# Patient Record
Sex: Female | Born: 1975 | Race: White | Hispanic: No | Marital: Single | Smoking: Never smoker
Health system: Southern US, Community
[De-identification: ages and names within clinical notes are randomized; demographics above are authoritative.]

## PROBLEM LIST (undated history)

## (undated) DIAGNOSIS — F329 Major depressive disorder, single episode, unspecified: Secondary | ICD-10-CM

## (undated) DIAGNOSIS — F32A Depression, unspecified: Secondary | ICD-10-CM

## (undated) DIAGNOSIS — K219 Gastro-esophageal reflux disease without esophagitis: Secondary | ICD-10-CM

## (undated) DIAGNOSIS — J45909 Unspecified asthma, uncomplicated: Secondary | ICD-10-CM

## (undated) DIAGNOSIS — G4761 Periodic limb movement disorder: Secondary | ICD-10-CM

## (undated) DIAGNOSIS — M5137 Other intervertebral disc degeneration, lumbosacral region: Secondary | ICD-10-CM

## (undated) HISTORY — PX: LUMBAR DISC SURGERY: SHX700

## (undated) HISTORY — PX: BACK SURGERY: SHX140

## (undated) HISTORY — PX: SHOULDER SURGERY: SHX246

## (undated) HISTORY — PX: MOUTH SURGERY: SHX715

---

## 2015-06-09 ENCOUNTER — Ambulatory Visit (HOSPITAL_COMMUNITY)
Admission: EM | Admit: 2015-06-09 | Discharge: 2015-06-09 | Disposition: A | Discharge status: Home / Self Care | Payer: BLUE CROSS/BLUE SHIELD | Attending: Family Medicine | Admitting: Family Medicine

## 2015-06-09 ENCOUNTER — Ambulatory Visit (INDEPENDENT_AMBULATORY_CARE_PROVIDER_SITE_OTHER): Payer: BLUE CROSS/BLUE SHIELD

## 2015-06-09 ENCOUNTER — Encounter (HOSPITAL_COMMUNITY): Payer: Self-pay | Admitting: Emergency Medicine

## 2015-06-09 DIAGNOSIS — S46912A Strain of unspecified muscle, fascia and tendon at shoulder and upper arm level, left arm, initial encounter: Secondary | ICD-10-CM

## 2015-06-09 NOTE — Discharge Instructions (Signed)
Rotator Cuff Injury °Rotator cuff injury is any type of injury to the set of muscles and tendons that make up the stabilizing unit of your shoulder. This unit holds the ball of your upper arm bone (humerus) in the socket of your shoulder blade (scapula).  °CAUSES °Injuries to your rotator cuff most commonly come from sports or activities that cause your arm to be moved repeatedly over your head. Examples of this include throwing, weight lifting, swimming, or racquet sports. Long lasting (chronic) irritation of your rotator cuff can cause soreness and swelling (inflammation), bursitis, and eventual damage to your tendons, such as a tear (rupture). °SIGNS AND SYMPTOMS °Acute rotator cuff tear: °· Sudden tearing sensation followed by severe pain shooting from your upper shoulder down your arm toward your elbow. °· Decreased range of motion of your shoulder because of pain and muscle spasm. °· Severe pain. °· Inability to raise your arm out to the side because of pain and loss of muscle power (large tears). °Chronic rotator cuff tear: °· Pain that usually is worse at night and may interfere with sleep. °· Gradual weakness and decreased shoulder motion as the pain worsens. °· Decreased range of motion. °Rotator cuff tendinitis:  °· Deep ache in your shoulder and the outside upper arm over your shoulder. °· Pain that comes on gradually and becomes worse when lifting your arm to the side or turning it inward. °DIAGNOSIS °Rotator cuff injury is diagnosed through a medical history, physical exam, and imaging exam. The medical history helps determine the type of rotator cuff injury. Your health care provider will look at your injured shoulder, feel the injured area, and ask you to move your shoulder in different positions. X-ray exams typically are done to rule out other causes of shoulder pain, such as fractures. MRI is the exam of choice for the most severe shoulder injuries because the images show muscles and tendons.    °TREATMENT  °Chronic tear: °· Medicine for pain, such as acetaminophen or ibuprofen. °· Physical therapy and range-of-motion exercises may be helpful in maintaining shoulder function and strength. °· Steroid injections into your shoulder joint. °· Surgical repair of the rotator cuff if the injury does not heal with noninvasive treatment. °Acute tear: °· Anti-inflammatory medicines such as ibuprofen and naproxen to help reduce pain and swelling. °· A sling to help support your arm and rest your rotator cuff muscles. Long-term use of a sling is not advised. It may cause significant stiffening of the shoulder joint. °· Surgery may be considered within a few weeks, especially in younger, active people, to return the shoulder to full function. °· Indications for surgical treatment include the following: °¨ Age younger than 60 years. °¨ Rotator cuff tears that are complete. °¨ Physical therapy, rest, and anti-inflammatory medicines have been used for 6-8 weeks, with no improvement. °¨ Employment or sporting activity that requires constant shoulder use. °Tendinitis: °· Anti-inflammatory medicines such as ibuprofen and naproxen to help reduce pain and swelling. °· A sling to help support your arm and rest your rotator cuff muscles. Long-term use of a sling is not advised. It may cause significant stiffening of the shoulder joint. °· Severe tendinitis may require: °¨ Steroid injections into your shoulder joint. °¨ Physical therapy. °¨ Surgery. °HOME CARE INSTRUCTIONS  °· Apply ice to your injury: °¨ Put ice in a plastic bag. °¨ Place a towel between your skin and the bag. °¨ Leave the ice on for 20 minutes, 2-3 times a day. °· If you   have a shoulder immobilizer (sling and straps), wear it until told otherwise by your health care provider. °· You may want to sleep on several pillows or in a recliner at night to lessen swelling and pain. °· Only take over-the-counter or prescription medicines for pain, discomfort, or fever as  directed by your health care provider. °· Do simple hand squeezing exercises with a soft rubber ball to decrease hand swelling. °SEEK MEDICAL CARE IF:  °· Your shoulder pain increases, or new pain or numbness develops in your arm, hand, or fingers. °· Your hand or fingers are colder than your other hand. °SEEK IMMEDIATE MEDICAL CARE IF:  °· Your arm, hand, or fingers are numb or tingling. °· Your arm, hand, or fingers are increasingly swollen and painful, or they turn white or blue. °MAKE SURE YOU: °· Understand these instructions. °· Will watch your condition. °· Will get help right away if you are not doing well or get worse. °  °This information is not intended to replace advice given to you by your health care provider. Make sure you discuss any questions you have with your health care provider. °  °Document Released: 02/12/2000 Document Revised: 02/19/2013 Document Reviewed: 09/26/2012 °Elsevier Interactive Patient Education ©2016 [employer] ° °
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
## 2015-08-09 ENCOUNTER — Ambulatory Visit (INDEPENDENT_AMBULATORY_CARE_PROVIDER_SITE_OTHER): Payer: BLUE CROSS/BLUE SHIELD

## 2015-08-09 ENCOUNTER — Ambulatory Visit (HOSPITAL_COMMUNITY)
Admission: EM | Admit: 2015-08-09 | Discharge: 2015-08-09 | Disposition: A | Discharge status: Home / Self Care | Payer: BLUE CROSS/BLUE SHIELD | Attending: Emergency Medicine | Admitting: Emergency Medicine

## 2015-08-09 ENCOUNTER — Encounter (HOSPITAL_COMMUNITY): Payer: Self-pay | Admitting: *Deleted

## 2015-08-09 DIAGNOSIS — M25552 Pain in left hip: Secondary | ICD-10-CM | POA: Diagnosis not present

## 2015-08-09 DIAGNOSIS — R52 Pain, unspecified: Secondary | ICD-10-CM

## 2015-08-09 HISTORY — DX: Major depressive disorder, single episode, unspecified: F32.9

## 2015-08-09 HISTORY — DX: Gastro-esophageal reflux disease without esophagitis: K21.9

## 2015-08-09 HISTORY — DX: Unspecified asthma, uncomplicated: J45.909

## 2015-08-09 HISTORY — DX: Other intervertebral disc degeneration, lumbosacral region: M51.37

## 2015-08-09 HISTORY — DX: Periodic limb movement disorder: G47.61

## 2015-08-09 HISTORY — DX: Depression, unspecified: F32.A

## 2015-08-09 MED ORDER — PREDNISONE 10 MG PO TABS: 20.0000 mg | ORAL_TABLET | Freq: Every day | ORAL | Status: AC

## 2015-08-09 NOTE — Discharge Instructions (Signed)
Cryotherapy °Cryotherapy is when you put ice on your injury. Ice helps lessen pain and puffiness (swelling) after an injury. Ice works the best when you start using it in the first 24 to 48 hours after an injury. °HOME CARE °· Put a dry or damp towel between the ice pack and your skin. °· You may press gently on the ice pack. °· Leave the ice on for no more than 10 to 20 minutes at a time. °· Check your skin after 5 minutes to make sure your skin is okay. °· Rest at least 20 minutes between ice pack uses. °· Stop using ice when your skin loses feeling (numbness). °· Do not use ice on someone who cannot tell you when it hurts. This includes small children and people with memory problems (dementia). °GET HELP RIGHT AWAY IF: °· You have white spots on your skin. °· Your skin turns blue or pale. °· Your skin feels waxy or hard. °· Your puffiness gets worse. °MAKE SURE YOU:  °· Understand these instructions. °· Will watch your condition. °· Will get help right away if you are not doing well or get worse. °  °This information is not intended to replace advice given to you by your health care provider. Make sure you discuss any questions you have with your health care provider. °  °Document Released: 08/03/2007 Document Revised: 05/09/2011 Document Reviewed: 10/07/2010 °Elsevier Interactive Patient Education ©2016 Elsevier Inc. ° °Heat Therapy °Heat therapy can help ease sore, stiff, injured, and tight muscles and joints. Heat relaxes your muscles, which may help ease your pain. Heat therapy should only be used on old, pre-existing, or long-lasting (chronic) injuries. Do not use heat therapy unless told by your doctor. °HOW TO USE HEAT THERAPY °There are several different kinds of heat therapy, including: °· Moist heat pack. °· Warm water bath. °· Hot water bottle. °· Electric heating pad. °· Heated gel pack. °· Heated wrap. °· Electric heating pad. °GENERAL HEAT THERAPY RECOMMENDATIONS  °· Do not sleep while using heat  therapy. Only use heat therapy while you are awake. °· Your skin may turn pink while using heat therapy. Do not use heat therapy if your skin turns red. °· Do not use heat therapy if you have new pain. °· High heat or long exposure to heat can cause burns. Be careful when using heat therapy to avoid burning your skin. °· Do not use heat therapy on areas of your skin that are already irritated, such as with a rash or sunburn. °GET HELP IF:  °· You have blisters, redness, swelling (puffiness), or numbness. °· You have new pain. °· Your pain is worse. °MAKE SURE YOU: °· Understand these instructions. °· Will watch your condition. °· Will get help right away if you are not doing well or get worse. °  °This information is not intended to replace advice given to you by your health care provider. Make sure you discuss any questions you have with your health care provider. °  °Document Released: 05/09/2011 Document Revised: 03/07/2014 Document Reviewed: 04/09/2013 °Elsevier Interactive Patient Education ©2016 Elsevier Inc. ° °

## 2015-08-09 NOTE — ED Notes (Signed)
Denies injury.  C/O left "ripping" hip pain x 1.5 wks.  Has tried her normal cyclobenzaprine, rest, ice, and heat without relief.

## 2015-08-09 NOTE — ED Provider Notes (Signed)
CSN: [id number]     Arrival date & time 08/09/15  1554 History   First MD Initiated Contact with Patient 08/09/15 1624     Chief Complaint  Patient presents with  . Hip Pain   (Consider location/radiation/quality/duration/timing/severity/associated sxs/prior Treatment) HPI History obtained from patient:  Pt presents with the cc of:  Left hip pain Duration of symptoms: 2 weeks Treatment prior to arrival: OTC, rest Context: sudden pain in left hip no known injury. Tearing sensation in hip Other symptoms include: hurts to get around Pain score: 6 FAMILY HISTORY:none mentioned    Past Medical History  Diagnosis Date  . Asthma   . GERD (gastroesophageal reflux disease)   . DDD (degenerative disc disease), lumbosacral   . Depression   . Periodic limb movement disorder (PLMD)    Past Surgical History  Procedure Laterality Date  . Back surgery    . Lumbar disc surgery    . Shoulder surgery      x3  . Mouth surgery     No family history on file. Social History  Substance Use Topics  . Smoking status: Never Smoker   . Smokeless tobacco: None  . Alcohol Use: Yes     Comment: occasionally   OB History    No data available     Review of Systems  Denies: HEADACHE, NAUSEA, ABDOMINAL PAIN, CHEST PAIN, CONGESTION, DYSURIA, SHORTNESS OF BREATH  Allergies  Baclofen; Compazine; Neosporin; Relafen; and Tramadol  Home Medications   Prior to Admission medications   Medication Sig Start Date End Date Taking? Authorizing Provider  albuterol (PROVENTIL HFA;VENTOLIN HFA) 108 (90 Base) MCG/ACT inhaler Inhale into the lungs every 6 (six) hours as needed for wheezing or shortness of breath.   Yes Historical Provider, MD  beclomethasone (QVAR) 40 MCG/ACT inhaler Inhale into the lungs 2 (two) times daily.   Yes Historical Provider, MD  cyclobenzaprine (FLEXERIL) 5 MG tablet Take 5 mg by mouth 3 (three) times daily as needed for muscle spasms.   Yes Historical Provider, MD  Diclofenac  Sodium (PENNSAID) 2 % SOLN Place onto the skin.   Yes Historical Provider, MD  ferrous sulfate 325 (65 FE) MG tablet Take 325 mg by mouth daily with breakfast.   Yes Historical Provider, MD  FLUoxetine (PROZAC) 40 MG capsule Take 40 mg by mouth daily.   Yes Historical Provider, MD  Gabapentin, Once-Daily, (GRALISE) 600 MG TABS Take by mouth.   Yes Historical Provider, MD  pantoprazole (PROTONIX) 20 MG tablet Take 20 mg by mouth daily.   Yes Historical Provider, MD  Armodafinil (NUVIGIL) 150 MG tablet Take 150 mg by mouth daily.    Historical Provider, MD  diazepam (VALIUM) 2 MG tablet Take 2 mg by mouth every 6 (six) hours as needed for anxiety.    Historical Provider, MD  oxyCODONE-acetaminophen (PERCOCET/ROXICET) 5-325 MG tablet Take by mouth every 4 (four) hours as needed for severe pain.    Historical Provider, MD   Meds Ordered and Administered this Visit  Medications - No data to display  BP 137/97 mmHg  Pulse 94  Temp(Src) 97.7 F (36.5 C) (Oral)  Resp 16  SpO2 99%  LMP 07/26/2015 (Approximate) No data found.   Physical Exam NURSES NOTES AND VITAL SIGNS REVIEWED. CONSTITUTIONAL: Well developed, well nourished, no acute distress HEENT: normocephalic, atraumatic EYES: Conjunctiva normal NECK:normal ROM, supple, no adenopathy PULMONARY:No respiratory distress, normal effort ABDOMINAL: Soft, ND, NT BS+, No CVAT MUSCULOSKELETAL: Normal ROM of all extremities,  SKIN: warm and  dry without rash PSYCHIATRIC: Mood and affect, behavior are normal  ED Course  Procedures (including critical care time)  Labs Review Labs Reviewed - No data to display  Imaging Review Dg Hip Unilat With Pelvis 2-3 Views Left  08/09/2015  CLINICAL DATA:  Left hip pain for the past 2 weeks. EXAM: DG HIP (WITH OR WITHOUT PELVIS) 2-3V LEFT COMPARISON:  None. FINDINGS: Clothing artifacts.  Normal appearing hips and lower lumbar spine. IMPRESSION: Normal examination. Electronically Signed   By: Beckie Salts  M.D.   On: 08/09/2015 16:32    Personal review of xray-and report was used in the MDM process.  Visual Acuity Review  Right Eye Distance:   Left Eye Distance:   Bilateral Distance:    Right Eye Near:   Left Eye Near:    Bilateral Near:      RX prednisone   MDM   1. Pain    Patient is reassured that there are no issues that require transfer to higher level of care at this time or additional tests. Patient is advised to continue home symptomatic treatment. Patient is advised that if there are new or worsening symptoms to attend the emergency department, contact primary care provider, or return to UC. Instructions of care provided discharged home in stable condition.    THIS NOTE WAS GENERATED USING A VOICE RECOGNITION SOFTWARE PROGRAM. ALL REASONABLE EFFORTS  WERE MADE TO PROOFREAD THIS DOCUMENT FOR ACCURACY.  I have verbally reviewed the discharge instructions with the patient. A printed AVS was given to the patient.  All questions were answered prior to discharge.      Tharon Aquas, PA 08/09/15 1654

## 2016-08-23 IMAGING — DX DG HIP (WITH OR WITHOUT PELVIS) 2-3V*L*
3 series · 3 of 3 positions shown · non-contrast
Comparison: None.

CLINICAL DATA: Left hip pain for the past 2 weeks.

EXAM:
DG HIP (WITH OR WITHOUT PELVIS) 2-3V LEFT

[hip frog leg]
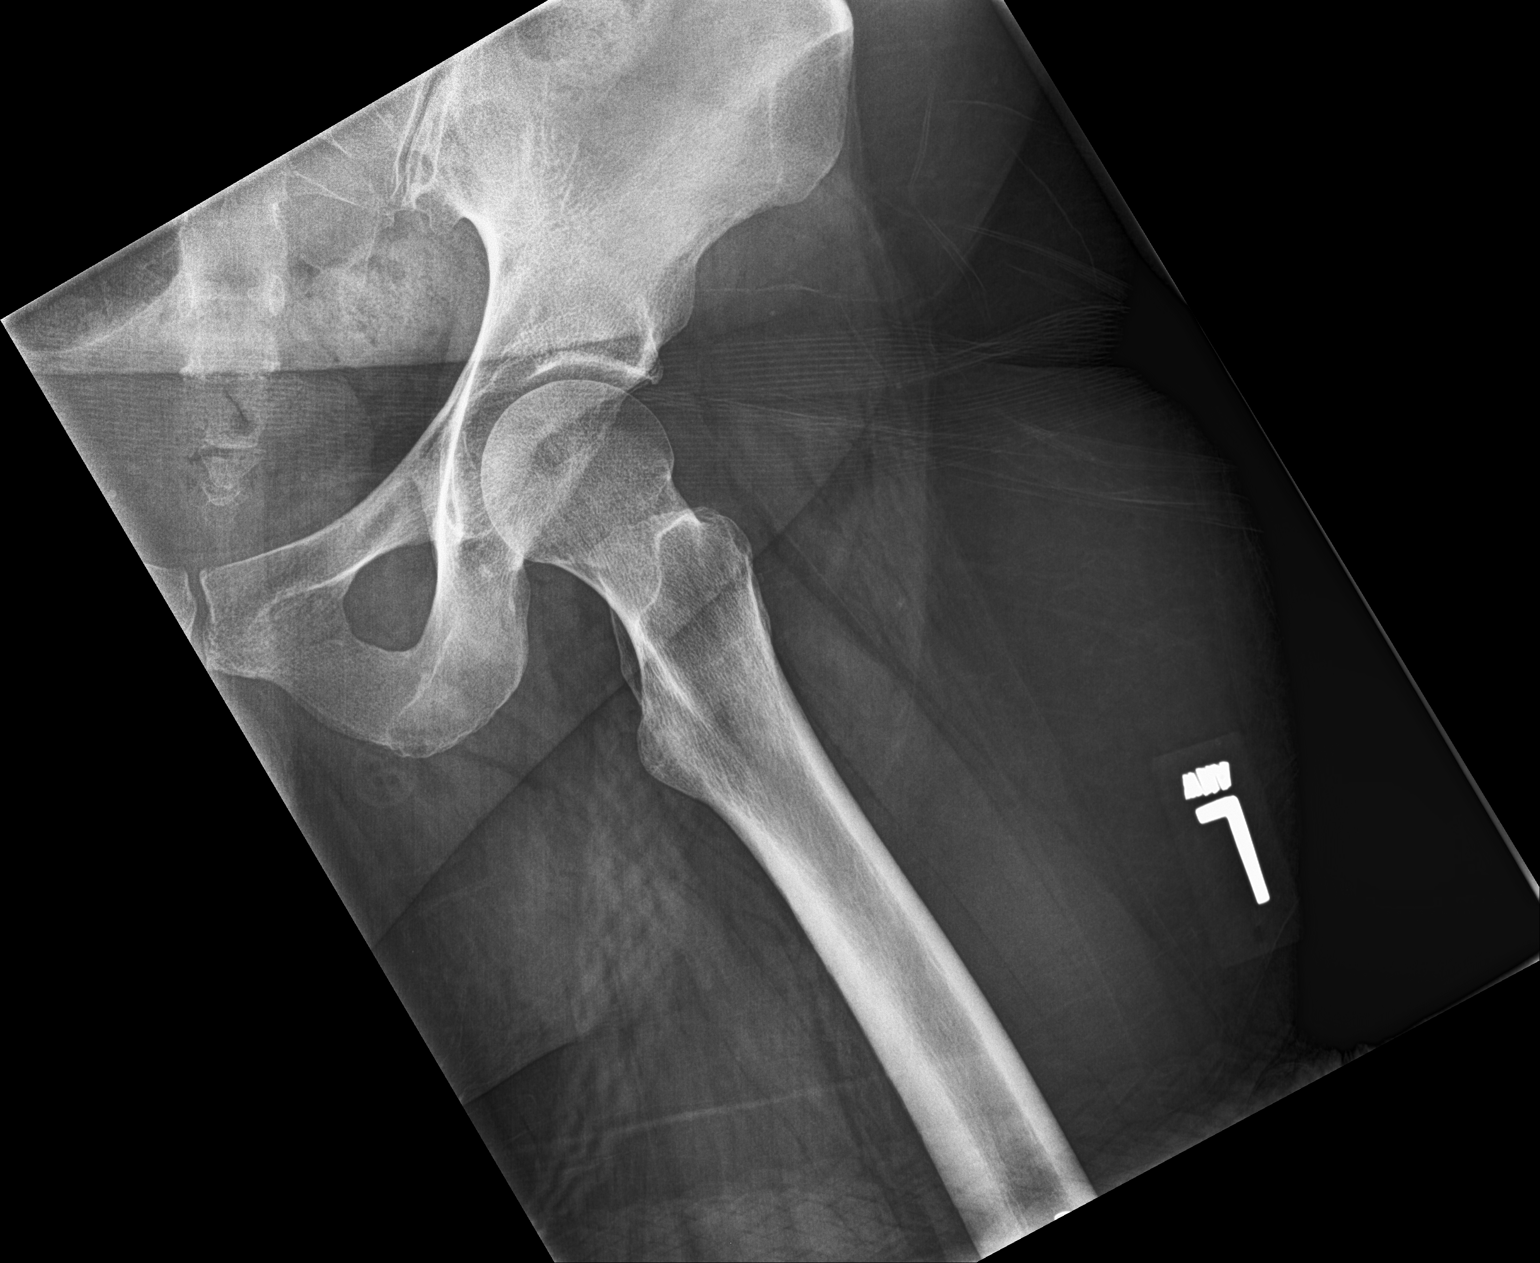

[hip ap]
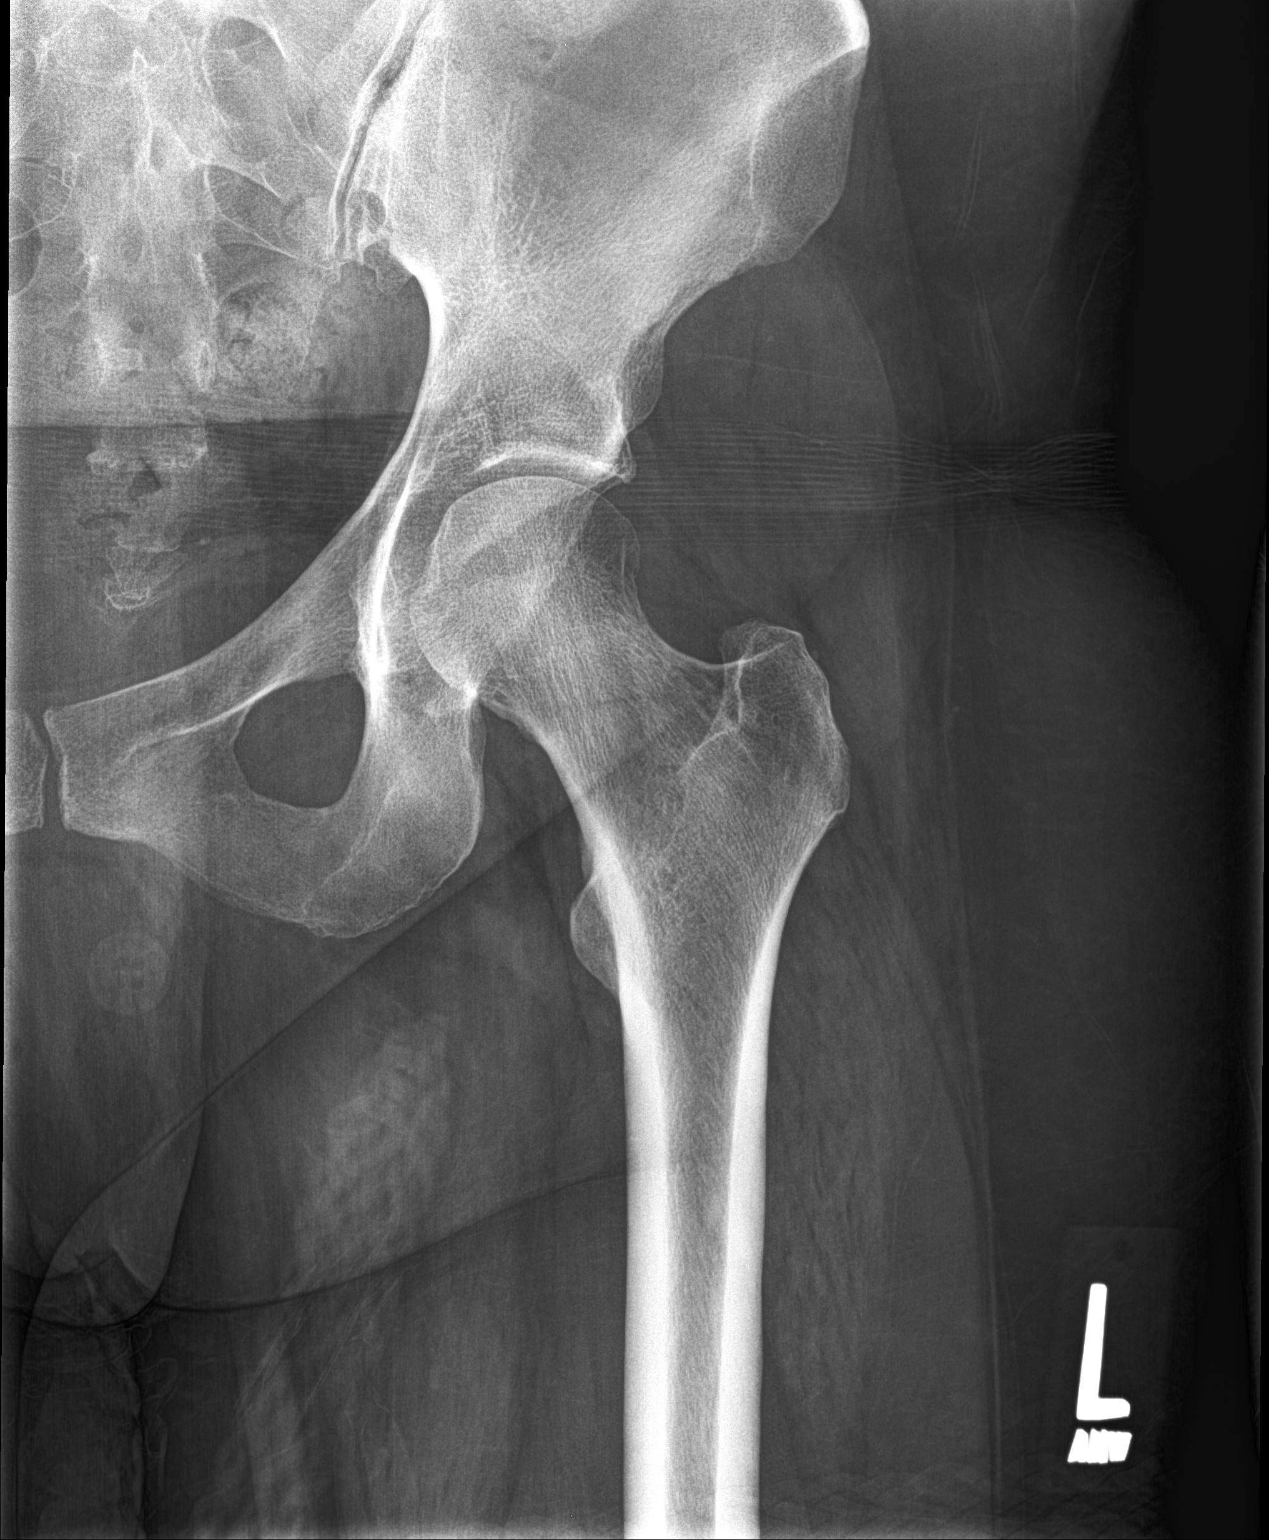

[pelvis ap]
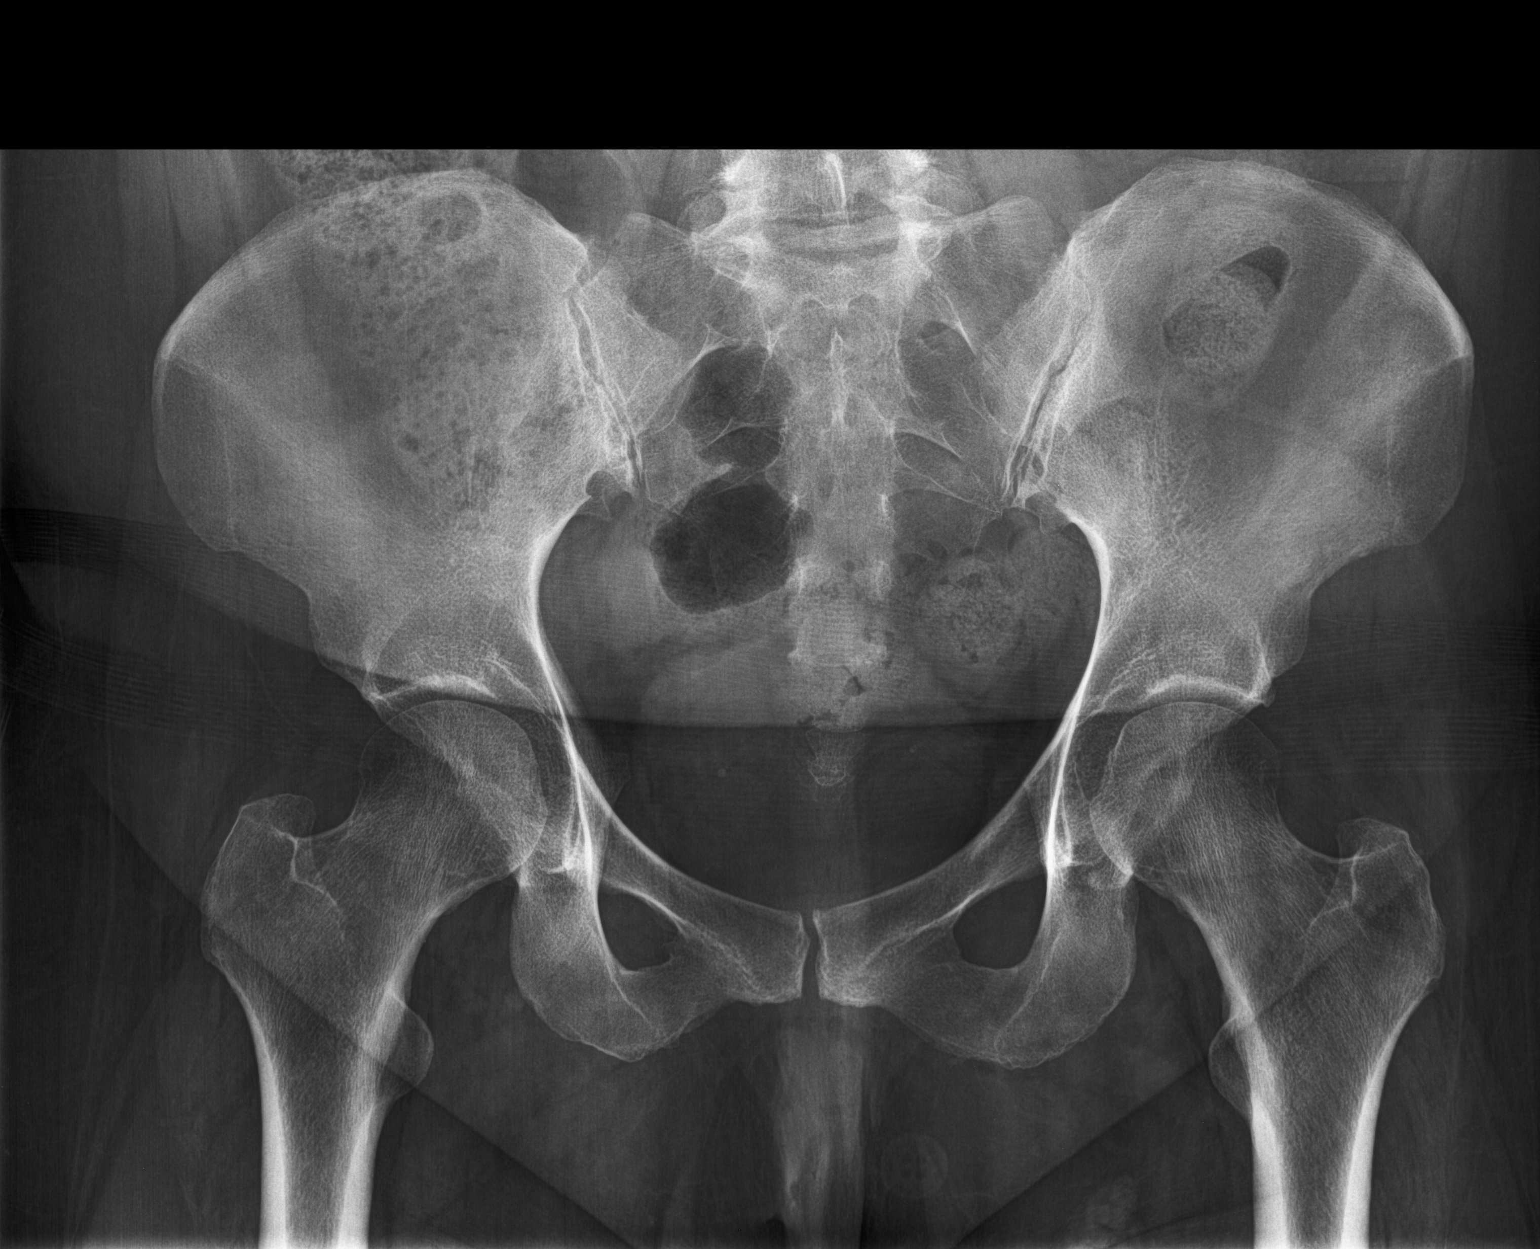

[3 of 3 positions shown; findings below may reference images not displayed]

FINDINGS: Clothing artifacts.  Normal appearing hips and lower lumbar spine.
IMPRESSION: Normal examination.
